# Patient Record
Sex: Male | Born: 1950 | Race: Black or African American | Hispanic: No | Marital: Single | Smoking: Former smoker
Health system: Southern US, Community
[De-identification: ages and names within clinical notes are randomized; demographics above are authoritative.]

## PROBLEM LIST (undated history)

## (undated) DIAGNOSIS — I10 Essential (primary) hypertension: Secondary | ICD-10-CM

## (undated) HISTORY — PX: FRACTURE SURGERY: SHX138

---

## 2016-03-16 DIAGNOSIS — M5416 Radiculopathy, lumbar region: Secondary | ICD-10-CM | POA: Insufficient documentation

## 2016-08-12 DIAGNOSIS — IMO0001 Reserved for inherently not codable concepts without codable children: Secondary | ICD-10-CM | POA: Insufficient documentation

## 2016-08-12 DIAGNOSIS — F17210 Nicotine dependence, cigarettes, uncomplicated: Secondary | ICD-10-CM | POA: Insufficient documentation

## 2016-08-12 DIAGNOSIS — R209 Unspecified disturbances of skin sensation: Secondary | ICD-10-CM

## 2016-08-12 DIAGNOSIS — M542 Cervicalgia: Secondary | ICD-10-CM | POA: Insufficient documentation

## 2016-10-05 HISTORY — PX: BACK SURGERY: SHX140

## 2018-10-10 ENCOUNTER — Encounter: Payer: Self-pay | Admitting: Emergency Medicine

## 2018-10-10 ENCOUNTER — Other Ambulatory Visit: Payer: Self-pay

## 2018-10-10 ENCOUNTER — Emergency Department: Payer: Medicare Other

## 2018-10-10 ENCOUNTER — Emergency Department
Admission: EM | Admit: 2018-10-10 | Discharge: 2018-10-10 | Disposition: A | Discharge status: Home / Self Care | Payer: Medicare Other | Attending: Emergency Medicine | Admitting: Emergency Medicine

## 2018-10-10 DIAGNOSIS — F1210 Cannabis abuse, uncomplicated: Secondary | ICD-10-CM | POA: Insufficient documentation

## 2018-10-10 DIAGNOSIS — K222 Esophageal obstruction: Secondary | ICD-10-CM

## 2018-10-10 DIAGNOSIS — R0789 Other chest pain: Secondary | ICD-10-CM | POA: Insufficient documentation

## 2018-10-10 DIAGNOSIS — Z87891 Personal history of nicotine dependence: Secondary | ICD-10-CM | POA: Insufficient documentation

## 2018-10-10 DIAGNOSIS — M5432 Sciatica, left side: Secondary | ICD-10-CM | POA: Diagnosis not present

## 2018-10-10 DIAGNOSIS — R202 Paresthesia of skin: Secondary | ICD-10-CM | POA: Diagnosis not present

## 2018-10-10 LAB — BASIC METABOLIC PANEL
Anion gap: 9 (ref 5–15)
BUN: 11 mg/dL (ref 8–23)
CO2: 22 mmol/L (ref 22–32)
Calcium: 8.9 mg/dL (ref 8.9–10.3)
Chloride: 108 mmol/L (ref 98–111)
Creatinine, Ser: 0.91 mg/dL (ref 0.61–1.24)
GFR calc Af Amer: >60 mL/min (ref >60)
GFR calc non Af Amer: >60 mL/min (ref >60)
Glucose, Bld: 120 mg/dL — ABNORMAL HIGH (ref 70–99)
Potassium: 3.6 mmol/L (ref 3.5–5.1)
Sodium: 139 mmol/L (ref 135–145)

## 2018-10-10 LAB — CBC
HCT: 44.7 % (ref 39.0–52.0)
Hemoglobin: 15 g/dL (ref 13.0–17.0)
MCH: 28.2 pg (ref 26.0–34.0)
MCHC: 33.6 g/dL (ref 30.0–36.0)
MCV: 84.2 fL (ref 80.0–100.0)
Platelets: 174 10*3/uL (ref 150–400)
RBC: 5.31 MIL/uL (ref 4.22–5.81)
RDW: 14.4 % (ref 11.5–15.5)
WBC: 8.4 10*3/uL (ref 4.0–10.5)
nRBC: 0 % (ref 0.0–0.2)

## 2018-10-10 LAB — TROPONIN I: Troponin I: <0.03 ng/mL (ref <0.03)

## 2018-10-10 MED ORDER — PREDNISONE 50 MG PO TABS: 50.0000 mg | ORAL_TABLET | Freq: Every day | ORAL | 0 refills | Status: AC

## 2018-10-10 NOTE — ED Provider Notes (Signed)
[facility] Emergency Department Provider Note   ____________________________________________    I have reviewed the triage vital signs and the nursing notes.   HISTORY  Chief Complaint Chest Pain     HPI Micheal Powers is a 68 y.o. male who presents with multiple complaints.  Patient reports he has had chest discomfort to the left side of his sternum today which is tender to the touch.  He denies injury to the area.  In addition he reports over the last 2 months he has had episodes of tingling in his left arm.  He also describes a history of sciatica in both legs after spine surgery many years ago.  In addition over the last 2 months he has had difficulty swallowing solid foods and is switched to more of a soft diet.  He has not seen a PCP in over 5 or 6 years.  History reviewed. No pertinent past medical history.  There are no active problems to display for this patient.   History reviewed. No pertinent surgical history.  Prior to Admission medications   Medication Sig Start Date End Date Taking? Authorizing Provider  predniSONE (DELTASONE) 50 MG tablet Take 1 tablet (50 mg total) by mouth daily with breakfast. 10/10/18   Lavonia Drafts, MD     Allergies Patient has no known allergies.  History reviewed. No pertinent family history.  Social History Social History   Tobacco Use  . Smoking status: Former Smoker    Types: Cigarettes    Last attempt to quit: 10/06/2018    Years since quitting: 0.0  . Smokeless tobacco: Never Used  Substance Use Topics  . Alcohol use: Yes    Comment: occasionally  . Drug use: Yes    Frequency: 3.0 times per week    Types: Marijuana    Review of Systems  Constitutional: No fever/chills Eyes: No visual changes.  ENT: As above Cardiovascular: As above Respiratory: Denies shortness of breath. Gastrointestinal: No abdominal pain.   Genitourinary: Negative for dysuria. Musculoskeletal: Negative for back  pain. Skin: Negative for rash. Neurological: Negative for headaches   ____________________________________________   PHYSICAL EXAM:  VITAL SIGNS: ED Triage Vitals  Enc Vitals Group     BP 10/10/18 1542 (!) 168/99     Pulse Rate 10/10/18 1542 72     Resp 10/10/18 1542 18     Temp 10/10/18 1542 98.6 F (37 C)     Temp Source 10/10/18 1542 Oral     SpO2 10/10/18 1542 98 %     Weight 10/10/18 1543 110.7 kg (244 lb)     Height 10/10/18 1543 1.905 m (6\' 3" )     Head Circumference --      Peak Flow --      Pain Score 10/10/18 1543 5     Pain Loc --      Pain Edu? --      Excl. in [place]? --     Constitutional: Alert and oriented. No acute distress.  Eyes: Conjunctivae are normal.   Nose: No congestion/rhinnorhea. Mouth/Throat: Mucous membranes are moist.    Cardiovascular: Normal rate, regular rhythm. Grossly normal heart sounds.  Good peripheral circulation.  Tenderness palpation along the left sternal border, no swelling or erythema Respiratory: Normal respiratory effort.  No retractions. Lungs CTAB. Gastrointestinal: Soft and nontender. No distention.  No CVA tenderness.  Musculoskeletal:   Warm and well perfused, normal strength in all extremities Neurologic:  Normal speech and language. No gross focal neurologic  deficits are appreciated.  Skin:  Skin is warm, dry and intact. No rash noted. Psychiatric: Mood and affect are normal. Speech and behavior are normal.  ____________________________________________   LABS (all labs ordered are listed, but only abnormal results are displayed)  Labs Reviewed  BASIC METABOLIC PANEL - Abnormal; Notable for the following components:      Result Value   Glucose, Bld 120 (*)    All other components within normal limits  CBC  TROPONIN I   ____________________________________________  EKG  ED ECG REPORT I, Lavonia Drafts, the attending physician, personally viewed and interpreted this ECG.  Date: 10/10/2018  Rhythm: normal  sinus rhythm QRS Axis: normal Intervals: normal ST/T Wave abnormalities: normal Narrative Interpretation: no evidence of acute ischemia  ____________________________________________  RADIOLOGY  Chest x-ray unremarkable ____________________________________________   PROCEDURES  Procedure(s) performed: No  Procedures   Critical Care performed: No ____________________________________________   INITIAL IMPRESSION / ASSESSMENT AND PLAN / ED COURSE  Pertinent labs & imaging results that were available during my care of the patient were reviewed by me and considered in my medical decision making (see chart for details).  Patient presents with multiple complaints.  Suspect costochondritis based on exam.  EKG, lab work, chest x-ray is quite reassuring.  In addition the patient appears to be describing esophageal stricture for which he will need follow-up with GI.  It also appears the patient is having significant sciatic/radiculopathy type pain, will refer to neurosurgery.  We will trial steroids to see if this helps with sciatica and costochondritis, recommend NSAIDs as well.      ____________________________________________   FINAL CLINICAL IMPRESSION(S) / ED DIAGNOSES  Final diagnoses:  Chest wall pain  Esophageal ring  Sciatica of left side        Note:  This document was prepared using Dragon voice recognition software and may include unintentional dictation errors.   Lavonia Drafts, MD 10/10/18 2149

## 2018-10-10 NOTE — ED Triage Notes (Signed)
Pt via pov from home with chest pain x more than one month. Pt has been waiting for his insurance to kick in. Also states he has numbness in one or both hands/arms intermittently also for the last month. Endorses sob, n/v (after eating). Pt alert & oriented, nad noted.

## 2018-10-14 ENCOUNTER — Telehealth: Payer: Self-pay | Admitting: Gastroenterology

## 2018-10-14 NOTE — Telephone Encounter (Signed)
Left vm for pt to call office and schedule ed f/u with Dr. Marius Ditch

## 2018-10-18 DIAGNOSIS — I10 Essential (primary) hypertension: Secondary | ICD-10-CM | POA: Insufficient documentation

## 2018-10-18 DIAGNOSIS — Z6832 Body mass index (BMI) 32.0-32.9, adult: Secondary | ICD-10-CM

## 2018-10-18 DIAGNOSIS — E6609 Other obesity due to excess calories: Secondary | ICD-10-CM | POA: Insufficient documentation

## 2018-10-21 ENCOUNTER — Other Ambulatory Visit: Payer: Self-pay | Admitting: Student

## 2018-10-21 DIAGNOSIS — G8929 Other chronic pain: Secondary | ICD-10-CM

## 2018-10-21 DIAGNOSIS — M5442 Lumbago with sciatica, left side: Principal | ICD-10-CM

## 2018-10-21 DIAGNOSIS — M5412 Radiculopathy, cervical region: Secondary | ICD-10-CM

## 2018-10-21 DIAGNOSIS — M5441 Lumbago with sciatica, right side: Principal | ICD-10-CM

## 2018-11-03 ENCOUNTER — Ambulatory Visit
Admission: RE | Admit: 2018-11-03 | Discharge: 2018-11-03 | Disposition: A | Discharge status: Home / Self Care | Payer: Medicare Other | Source: Ambulatory Visit | Attending: Student | Admitting: Student

## 2018-11-03 DIAGNOSIS — G8929 Other chronic pain: Secondary | ICD-10-CM

## 2018-11-03 DIAGNOSIS — M5442 Lumbago with sciatica, left side: Secondary | ICD-10-CM | POA: Insufficient documentation

## 2018-11-03 DIAGNOSIS — M5441 Lumbago with sciatica, right side: Secondary | ICD-10-CM | POA: Diagnosis present

## 2018-11-03 DIAGNOSIS — M5412 Radiculopathy, cervical region: Secondary | ICD-10-CM

## 2018-11-04 ENCOUNTER — Encounter: Payer: Self-pay | Admitting: Gastroenterology

## 2018-11-04 ENCOUNTER — Ambulatory Visit (INDEPENDENT_AMBULATORY_CARE_PROVIDER_SITE_OTHER): Payer: Medicare Other | Admitting: Gastroenterology

## 2018-11-04 ENCOUNTER — Other Ambulatory Visit: Payer: Self-pay

## 2018-11-04 VITALS — BP 137/90 | HR 60 | Resp 17 | Ht 74.0 in | Wt 247.4 lb

## 2018-11-04 DIAGNOSIS — R1013 Epigastric pain: Secondary | ICD-10-CM

## 2018-11-04 DIAGNOSIS — Z1211 Encounter for screening for malignant neoplasm of colon: Secondary | ICD-10-CM

## 2018-11-04 NOTE — Progress Notes (Signed)
Cephas Darby, MD [address]  [place]  [place], [place] [address]  Main: [phone]  Fax: [phone]    Gastroenterology Consultation  Referring Provider:     No ref. provider found Primary Care Physician:  [name], MD Primary Gastroenterologist:  Dr. Cephas Darby Reason for Consultation: Dyspepsia        HPI:   Micheal Powers is a 68 y.o. pleasant male with no significant past medical history referred by Dr. [name], MD  for consultation & management of 2 months h/o food sitting in his epigastric area, bloating and regurgitation.  He denies heartburn, dysphagia.  He has not tried any medications.  He modified his diet to boiled, soft foods which partially relieved his symptoms.  He went to ER early January secondary to chest pain, troponins negative, EKG unremarkable.  He was subsequently seen by his PCP at [facility] after ER visit.  He was suggested no need for any further cardiac work-up  Patient drinks sodas regularly He does report intermittent diarrhea, nonbloody.  Reports gaining weight, has been dealing with low back pain which has limited his physical activity.  He is retired  He smokes up to 4 cigarettes/day, denies drinking alcohol heavily  NSAIDs: None  Antiplts/Anticoagulants/Anti thrombotics: None  GI Procedures: None He denies family history of GI malignancy  No past medical history on file.  No past surgical history on file.  Current Outpatient Medications:  .  amLODipine (NORVASC) 10 MG tablet, Take by mouth., Disp: , Rfl:  .  gabapentin (NEURONTIN) 300 MG capsule, , Disp: , Rfl:  .  aspirin EC 81 MG tablet, Take by mouth., Disp: , Rfl:  .  benazepril (LOTENSIN) 40 MG tablet, Take by mouth., Disp: , Rfl:  .  hydrochlorothiazide (HYDRODIURIL) 25 MG tablet, , Disp: , Rfl:  .  oxyCODONE-acetaminophen (PERCOCET/ROXICET) 5-325 MG tablet, Take by mouth., Disp: , Rfl:  .  predniSONE (DELTASONE) 50 MG tablet, Take 1 tablet (50 mg  total) by mouth daily with breakfast. (Patient not taking: Reported on 11/04/2018), Disp: 5 tablet, Rfl: 0 .  traMADol (ULTRAM) 50 MG tablet, Take by mouth., Disp: , Rfl:   No family history on file.   Social History   Tobacco Use  . Smoking status: Former Smoker    Types: Cigarettes    Last attempt to quit: 10/06/2018    Years since quitting: 0.0  . Smokeless tobacco: Never Used  Substance Use Topics  . Alcohol use: Yes    Comment: occasionally  . Drug use: Yes    Frequency: 3.0 times per week    Types: Marijuana    Allergies as of 11/04/2018  . (No Known Allergies)    Review of Systems:    All systems reviewed and negative except where noted in HPI.   Physical Exam:  BP 137/90 (BP Location: Left Arm, Patient Position: Sitting, Cuff Size: Large)   Pulse 60   Resp 17   Ht 6\' 2"  (1.88 m)   Wt 247 lb 6.4 oz (112.2 kg)   SpO2 98%   BMI 31.76 kg/m  No LMP for male patient.  General:   Alert,  Well-developed, well-nourished, pleasant and cooperative in NAD Head:  Normocephalic and atraumatic. Eyes:  Sclera clear, no icterus.   Conjunctiva pink. Ears:  Normal auditory acuity. Nose:  No deformity, discharge, or lesions. Mouth:  No deformity or lesions,oropharynx pink & moist. Neck:  Supple; no masses or thyromegaly. Lungs:  Respirations even and unlabored.  Clear throughout to auscultation.   No wheezes, crackles, or rhonchi. No acute distress. Heart:  Regular rate and rhythm; no murmurs, clicks, rubs, or gallops. Abdomen:  Normal bowel sounds. Soft, non-tender and non-distended without masses, hepatosplenomegaly or hernias noted.  No guarding or rebound tenderness.   Rectal: Not performed Msk:  Symmetrical without gross deformities. Good, equal movement & strength bilaterally. Pulses:  Normal pulses noted. Extremities:  No clubbing or edema.  No cyanosis. Neurologic:  Alert and oriented x3;  grossly normal neurologically. Skin:  Intact without significant lesions or  rashes. No jaundice. Psych:  Alert and cooperative. Normal mood and affect.  Imaging Studies: No abdominal imaging  Assessment and Plan:   [name] is a 68 y.o. African-American male with hypertension, with dyspepsia and overdue for colon cancer screening  Dyspepsia Recommend EGD Advised him to avoid carbonated beverages completely  Colon cancer screening, overdue Discussed with him about screening colonoscopy  I have discussed alternative options, risks & benefits,  which include, but are not limited to, bleeding, infection, perforation,respiratory complication & drug reaction.  The patient agrees with this plan & written consent will be obtained.     Follow up in 4 to 6 weeks   Cephas Darby, MD

## 2018-11-11 ENCOUNTER — Encounter: Payer: Self-pay | Admitting: Emergency Medicine

## 2018-11-14 ENCOUNTER — Ambulatory Visit
Admission: RE | Admit: 2018-11-14 | Discharge: 2018-11-14 | Disposition: A | Discharge status: Home / Self Care | Payer: Medicare Other | Source: Ambulatory Visit | Attending: Gastroenterology | Admitting: Gastroenterology

## 2018-11-14 ENCOUNTER — Other Ambulatory Visit: Payer: Self-pay

## 2018-11-14 ENCOUNTER — Ambulatory Visit: Payer: Medicare Other | Admitting: Anesthesiology

## 2018-11-14 ENCOUNTER — Encounter
Admission: RE | Disposition: A | Discharge status: Home / Self Care | Payer: Self-pay | Source: Ambulatory Visit | Attending: Gastroenterology

## 2018-11-14 DIAGNOSIS — K2980 Duodenitis without bleeding: Secondary | ICD-10-CM | POA: Diagnosis not present

## 2018-11-14 DIAGNOSIS — D128 Benign neoplasm of rectum: Secondary | ICD-10-CM | POA: Diagnosis not present

## 2018-11-14 DIAGNOSIS — Z7982 Long term (current) use of aspirin: Secondary | ICD-10-CM | POA: Diagnosis not present

## 2018-11-14 DIAGNOSIS — Z1211 Encounter for screening for malignant neoplasm of colon: Secondary | ICD-10-CM | POA: Diagnosis present

## 2018-11-14 DIAGNOSIS — K6289 Other specified diseases of anus and rectum: Secondary | ICD-10-CM | POA: Diagnosis not present

## 2018-11-14 DIAGNOSIS — Z79899 Other long term (current) drug therapy: Secondary | ICD-10-CM | POA: Insufficient documentation

## 2018-11-14 DIAGNOSIS — K30 Functional dyspepsia: Secondary | ICD-10-CM | POA: Insufficient documentation

## 2018-11-14 DIAGNOSIS — Z87891 Personal history of nicotine dependence: Secondary | ICD-10-CM | POA: Diagnosis not present

## 2018-11-14 DIAGNOSIS — K3189 Other diseases of stomach and duodenum: Secondary | ICD-10-CM

## 2018-11-14 DIAGNOSIS — I10 Essential (primary) hypertension: Secondary | ICD-10-CM | POA: Insufficient documentation

## 2018-11-14 DIAGNOSIS — K621 Rectal polyp: Secondary | ICD-10-CM

## 2018-11-14 DIAGNOSIS — R1013 Epigastric pain: Secondary | ICD-10-CM

## 2018-11-14 HISTORY — PX: COLONOSCOPY WITH PROPOFOL: SHX5780

## 2018-11-14 HISTORY — PX: ESOPHAGOGASTRODUODENOSCOPY (EGD) WITH PROPOFOL: SHX5813

## 2018-11-14 HISTORY — DX: Essential (primary) hypertension: I10

## 2018-11-14 SURGERY — ESOPHAGOGASTRODUODENOSCOPY (EGD) WITH PROPOFOL
Anesthesia: General

## 2018-11-14 MED ORDER — LIDOCAINE HCL (CARDIAC) PF 100 MG/5ML IV SOSY
PREFILLED_SYRINGE | INTRAVENOUS | Status: DC | PRN
  Administered 2018-11-14: 30 mg via INTRAVENOUS

## 2018-11-14 MED ORDER — MIDAZOLAM HCL 2 MG/2ML IJ SOLN
INTRAMUSCULAR | Status: DC | PRN
  Administered 2018-11-14: 2 mg via INTRAVENOUS

## 2018-11-14 MED ORDER — PROPOFOL 500 MG/50ML IV EMUL
INTRAVENOUS | Status: DC | PRN
  Administered 2018-11-14: 120 ug/kg/min via INTRAVENOUS

## 2018-11-14 MED ORDER — MIDAZOLAM HCL 2 MG/2ML IJ SOLN
INTRAMUSCULAR | Status: AC
  Filled 2018-11-14: qty 2

## 2018-11-14 MED ORDER — SODIUM CHLORIDE 0.9 % IV SOLN
INTRAVENOUS | Status: DC
  Administered 2018-11-14: 08:00:00 via INTRAVENOUS

## 2018-11-14 MED ORDER — PROPOFOL 500 MG/50ML IV EMUL
INTRAVENOUS | Status: AC
  Filled 2018-11-14: qty 50

## 2018-11-14 MED ORDER — FENTANYL CITRATE (PF) 100 MCG/2ML IJ SOLN
INTRAMUSCULAR | Status: DC | PRN
  Administered 2018-11-14: 25 ug via INTRAVENOUS
  Administered 2018-11-14: 50 ug via INTRAVENOUS
  Administered 2018-11-14: 25 ug via INTRAVENOUS

## 2018-11-14 MED ORDER — BUTAMBEN-TETRACAINE-BENZOCAINE 2-2-14 % EX AERO
INHALATION_SPRAY | CUTANEOUS | Status: AC
  Filled 2018-11-14: qty 5

## 2018-11-14 MED ORDER — FENTANYL CITRATE (PF) 100 MCG/2ML IJ SOLN
INTRAMUSCULAR | Status: AC
  Filled 2018-11-14: qty 2

## 2018-11-14 MED ORDER — LIDOCAINE HCL (PF) 2 % IJ SOLN
INTRAMUSCULAR | Status: AC
  Filled 2018-11-14: qty 10

## 2018-11-14 NOTE — H&P (Signed)
Cephas Darby, MD [address]  [place]  [place], [place] [address]  Main: [phone]  Fax: [phone] Pager: [phone]  Primary Care Physician:  [name], MD Primary Gastroenterologist:  Dr. Cephas Darby  Pre-Procedure History & Physical: HPI:  Micheal Powers is a 68 y.o. male is here for an endoscopy and colonoscopy.   Past Medical History:  Diagnosis Date  . Hypertension     Past Surgical History:  Procedure Laterality Date  . BACK SURGERY  2018   lumbar  . FRACTURE SURGERY Right    ring finger    Prior to Admission medications   Medication Sig Start Date End Date Taking? Authorizing Provider  gabapentin (NEURONTIN) 300 MG capsule  10/18/18  Yes [provider]  hydrochlorothiazide (HYDRODIURIL) 25 MG tablet  10/18/18  Yes [provider]  amLODipine (NORVASC) 10 MG tablet Take by mouth. 10/06/16   [provider]  aspirin EC 81 MG tablet Take by mouth. 08/28/15   [provider]  benazepril (LOTENSIN) 40 MG tablet Take by mouth. 02/11/17   [provider]  oxyCODONE-acetaminophen (PERCOCET/ROXICET) 5-325 MG tablet Take by mouth.    [provider]  predniSONE (DELTASONE) 50 MG tablet Take 1 tablet (50 mg total) by mouth daily with breakfast. Patient not taking: Reported on 11/04/2018 10/10/18   Lavonia Drafts, MD  traMADol (ULTRAM) 50 MG tablet Take by mouth.    [provider]    Allergies as of 11/04/2018  . (No Known Allergies)    History reviewed. No pertinent family history.  Social History   Socioeconomic History  . Marital status: Single    Spouse name: Not on file  . Number of children: Not on file  . Years of education: Not on file  . Highest education level: Not on file  Occupational History  . Not on file  Social Needs  . Financial resource strain: Not on file  . Food insecurity:    Worry: Not on file    Inability: Not on file  . Transportation needs:   Medical: Not on file    Non-medical: Not on file  Tobacco Use  . Smoking status: Former Smoker    Types: Cigarettes    Last attempt to quit: 10/06/2018    Years since quitting: 0.1  . Smokeless tobacco: Never Used  Substance and Sexual Activity  . Alcohol use: Yes    Comment: occasionally  . Drug use: Yes    Frequency: 3.0 times per week    Types: Marijuana    Comment: last 2 weeks  . Sexual activity: Not on file  Lifestyle  . Physical activity:    Days per week: Not on file    Minutes per session: Not on file  . Stress: Not on file  Relationships  . Social connections:    Talks on phone: Not on file    Gets together: Not on file    Attends religious service: Not on file    Active member of club or organization: Not on file    Attends meetings of clubs or organizations: Not on file    Relationship status: Not on file  . Intimate partner violence:    Fear of current or ex partner: Not on file    Emotionally abused: Not on file    Physically abused: Not on file    Forced sexual activity: Not on file  Other Topics Concern  . Not on file  Social History Narrative  .  Not on file    Review of Systems: See HPI, otherwise negative ROS  Physical Exam: BP (!) 163/99   Pulse 71   Temp 98.3 F (36.8 C) (Oral)   Resp 18   Ht 6\' 2"  (1.88 m)   Wt 111.6 kg   SpO2 100%   BMI 31.58 kg/m  General:   Alert,  pleasant and cooperative in NAD Head:  Normocephalic and atraumatic. Neck:  Supple; no masses or thyromegaly. Lungs:  Clear throughout to auscultation.    Heart:  Regular rate and rhythm. Abdomen:  Soft, nontender and nondistended. Normal bowel sounds, without guarding, and without rebound.   Neurologic:  Alert and  oriented x4;  grossly normal neurologically.  Impression/Plan: Valon Glasscock is here for an endoscopy and colonoscopy to be performed for dyspepsia,colon cancer screening  Risks, benefits, limitations, and alternatives regarding  endoscopy and colonoscopy  have been reviewed with the patient.  Questions have been answered.  All parties agreeable.   Sherri Sear, MD  11/14/2018, 8:28 AM

## 2018-11-14 NOTE — Anesthesia Procedure Notes (Signed)
Performed by: Cook-Martin, Tonjua Rossetti Pre-anesthesia Checklist: Patient identified, Emergency Drugs available, Suction available, Patient being monitored and Timeout performed Patient Re-evaluated:Patient Re-evaluated prior to induction Oxygen Delivery Method: Nasal cannula Preoxygenation: Pre-oxygenation with 100% oxygen Induction Type: IV induction Placement Confirmation: CO2 detector and positive ETCO2       

## 2018-11-14 NOTE — Anesthesia Preprocedure Evaluation (Signed)
Anesthesia Evaluation  Patient identified by MRN, date of birth, ID band Patient awake    Reviewed: Allergy & Precautions, H&P , NPO status , Patient's Chart, lab work & pertinent test results, reviewed documented beta blocker date and time   History of Anesthesia Complications Negative for: history of anesthetic complications  Airway Mallampati: I  TM Distance: >3 FB Neck ROM: full    Dental  (+) Dental Advidsory Given, Caps, Missing, Teeth Intact, Chipped   Pulmonary neg pulmonary ROS, former smoker,           Cardiovascular Exercise Tolerance: Good hypertension, (-) angina(-) CAD, (-) Past MI, (-) Cardiac Stents and (-) CABG (-) dysrhythmias (-) Valvular Problems/Murmurs     Neuro/Psych negative neurological ROS  negative psych ROS   GI/Hepatic negative GI ROS, Neg liver ROS,   Endo/Other  negative endocrine ROS  Renal/GU negative Renal ROS  negative genitourinary   Musculoskeletal   Abdominal   Peds  Hematology negative hematology ROS (+)   Anesthesia Other Findings Past Medical History: No date: Hypertension   Reproductive/Obstetrics negative OB ROS                             Anesthesia Physical Anesthesia Plan  ASA: II  Anesthesia Plan: General   Post-op Pain Management:    Induction: Intravenous  PONV Risk Score and Plan: 2 and Propofol infusion and TIVA  Airway Management Planned: Natural Airway and Nasal Cannula  Additional Equipment:   Intra-op Plan:   Post-operative Plan:   Informed Consent: I have reviewed the patients History and Physical, chart, labs and discussed the procedure including the risks, benefits and alternatives for the proposed anesthesia with the patient or authorized representative who has indicated his/her understanding and acceptance.     Dental Advisory Given  Plan Discussed with: Anesthesiologist, CRNA and Surgeon  Anesthesia Plan  Comments:         Anesthesia Quick Evaluation

## 2018-11-14 NOTE — Anesthesia Post-op Follow-up Note (Signed)
Anesthesia QCDR form completed.        

## 2018-11-14 NOTE — Op Note (Signed)
[facility] Gastroenterology Patient Name: Micheal Powers Procedure Date: 11/14/2018 7:46 AM MRN: [id number] Account #: [id number] Date of Birth: [birth year] Admit Type: Outpatient Age: 68 Room: [facility] ENDO ROOM 2 Gender: Male Note Status: Finalized Procedure:            Upper GI endoscopy Indications:          Dyspepsia, Indigestion Providers:            Lin Landsman MD, MD Referring MD:         [name] (Referring MD) Medicines:            Monitored Anesthesia Care Complications:        No immediate complications. Estimated blood loss:                        Minimal. Procedure:            Pre-Anesthesia Assessment:                       - Prior to the procedure, a History and Physical was                        performed, and patient medications and allergies were                        reviewed. The patient is competent. The risks and                        benefits of the procedure and the sedation options and                        risks were discussed with the patient. All questions                        were answered and informed consent was obtained.                        Patient identification and proposed procedure were                        verified by the physician, the nurse, the                        anesthesiologist, the anesthetist and the technician in                        the pre-procedure area in the procedure room in the                        endoscopy suite. Mental Status Examination: alert and                        oriented. Airway Examination: normal oropharyngeal                        airway and neck mobility. Respiratory Examination:                        clear to auscultation. CV Examination: normal.  Prophylactic Antibiotics: The patient does not require                        prophylactic antibiotics. Prior Anticoagulants: The                        patient has taken no previous anticoagulant  or                        antiplatelet agents. ASA Grade Assessment: II - A                        patient with mild systemic disease. After reviewing the                        risks and benefits, the patient was deemed in                        satisfactory condition to undergo the procedure. The                        anesthesia plan was to use monitored anesthesia care                        (MAC). Immediately prior to administration of                        medications, the patient was re-assessed for adequacy                        to receive sedatives. The heart rate, respiratory rate,                        oxygen saturations, blood pressure, adequacy of                        pulmonary ventilation, and response to care were                        monitored throughout the procedure. The physical status                        of the patient was re-assessed after the procedure.                       After obtaining informed consent, the endoscope was                        passed under direct vision. Throughout the procedure,                        the patient's blood pressure, pulse, and oxygen                        saturations were monitored continuously. The Endoscope                        was introduced through the mouth, and advanced to the  second part of duodenum. The upper GI endoscopy was                        accomplished without difficulty. The patient tolerated                        the procedure well. Findings:      A medium-sized polypoid and ulcerated questionable mass/fullness with no       bleeding was found filling the duodenal bulb. Biopsies were taken with a       cold forceps for histology.      The second portion of the duodenum was normal.      Diffuse mildly erythematous mucosa without bleeding was found in the       gastric body. Biopsies were taken with a cold forceps for Helicobacter       pylori testing.      Two umbilicated  lesions or sperficial ulcers with raised edges measuring       5 mm in diameter were found in the prepyloric region of the stomach.      The incisura and gastric antrum were normal. Biopsies were taken with a       cold forceps for Helicobacter pylori testing.      The cardia and gastric fundus were normal on retroflexion.      Esophagogastric landmarks were identified: the gastroesophageal junction       was found at 38 cm from the incisors.      The gastroesophageal junction and examined esophagus were normal. Impression:           - Rule out malignancy, duodenal mass. Biopsied.                       - Normal second portion of the duodenum.                       - Erythematous mucosa in the gastric body. Biopsied.                       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                       - Normal incisura and antrum. Biopsied.                       - Esophagogastric landmarks identified.                       - Normal gastroesophageal junction and esophagus. Recommendation:       - Await pathology results. Procedure Code(s):    --- Professional ---                       [phone], Esophagogastroduodenoscopy, flexible, transoral;                        with biopsy, single or multiple Diagnosis Code(s):    --- Professional ---                       K31.89, Other diseases of  stomach and duodenum                       R10.13, Epigastric pain                       K30, Functional dyspepsia CPT copyright 2018 [employer]. All rights reserved. The codes documented in this report are preliminary and upon coder review may  be revised to meet current compliance requirements. Dr. Ulyess Mort Lin Landsman MD, MD 11/14/2018 9:16:29 AM This report has been signed electronically. Number of Addenda: 0 Note Initiated On: 11/14/2018 7:46 AM      [facility]

## 2018-11-14 NOTE — Op Note (Signed)
[facility] Gastroenterology Patient Name: Micheal Powers Procedure Date: 11/14/2018 7:45 AM MRN: [id number] Account #: [id number] Date of Birth: July 24, 1951 Admit Type: Outpatient Age: 68 Room: [facility] ENDO ROOM 2 Gender: Male Note Status: Finalized Procedure:            Colonoscopy Indications:          Screening for colorectal malignant neoplasm, This is                        the patient's first colonoscopy Providers:            Lin Landsman MD, MD Referring MD:         [name] (Referring MD) Medicines:            Monitored Anesthesia Care Complications:        No immediate complications. Estimated blood loss: None. Procedure:            Pre-Anesthesia Assessment:                       - Prior to the procedure, a History and Physical was                        performed, and patient medications and allergies were                        reviewed. The patient is competent. The risks and                        benefits of the procedure and the sedation options and                        risks were discussed with the patient. All questions                        were answered and informed consent was obtained.                        Patient identification and proposed procedure were                        verified by the physician, the nurse, the                        anesthesiologist, the anesthetist and the technician in                        the pre-procedure area in the procedure room in the                        endoscopy suite. Mental Status Examination: alert and                        oriented. Airway Examination: normal oropharyngeal                        airway and neck mobility. Respiratory Examination:                        clear to auscultation. CV Examination: normal.  Prophylactic Antibiotics: The patient does not require                        prophylactic antibiotics. Prior Anticoagulants: The          patient has taken no previous anticoagulant or                        antiplatelet agents. ASA Grade Assessment: II - A                        patient with mild systemic disease. After reviewing the                        risks and benefits, the patient was deemed in                        satisfactory condition to undergo the procedure. The                        anesthesia plan was to use monitored anesthesia care                        (MAC). Immediately prior to administration of                        medications, the patient was re-assessed for adequacy                        to receive sedatives. The heart rate, respiratory rate,                        oxygen saturations, blood pressure, adequacy of                        pulmonary ventilation, and response to care were                        monitored throughout the procedure. The physical status                        of the patient was re-assessed after the procedure.                       After obtaining informed consent, the colonoscope was                        passed under direct vision. Throughout the procedure,                        the patient's blood pressure, pulse, and oxygen                        saturations were monitored continuously. The                        Colonoscope was introduced through the anus and                        advanced to the the terminal ileum, with identification  of the appendiceal orifice and IC valve. The                        colonoscopy was performed without difficulty. The                        patient tolerated the procedure well. The quality of                        the bowel preparation was adequate to identify polyps 6                        mm and larger in size. Findings:      The digital rectal exam revealed a soft rectal mass palpated 5.0 cm from       the anal verge.      The terminal ileum appeared normal.      A 15 mm polyp was found in the  rectum (benign-appearing lesion). The       polyp was sessile. The polyp was removed with a hot snare. Resection and       retrieval were complete.      The retroflexed view of the distal rectum and anal verge was normal and       showed no anal or rectal abnormalities.      The exam was otherwise without abnormality.      A small amount of liquid stool was found in the entire colon,       interfering with visualization. Lavage of the area was performed using       50 - 200 mL of sterile water, resulting in clearance with adequate       visualization. Impression:           - Rectal mass 5.0 cm from the anal verge.                       - The examined portion of the ileum was normal.                       - One benign appearing 15 mm polyp in the rectum,                        removed with a hot snare. Resected and retrieved.                       - The distal rectum and anal verge are normal on                        retroflexion view.                       - The examination was otherwise normal.                       - Stool in the entire examined colon. Recommendation:       - Discharge patient to home (with spouse).                       - Resume previous diet today.                       -  Continue present medications.                       - Await pathology results.                       - Repeat colonoscopy in 3 years for surveillance.                       - Return to my office as previously scheduled. Procedure Code(s):    --- Professional ---                       [phone], Colonoscopy, flexible; with removal of tumor(s),                        polyp(s), or other lesion(s) by snare technique Diagnosis Code(s):    --- Professional ---                       Z12.11, Encounter for screening for malignant neoplasm                        of colon                       K62.89, Other specified diseases of anus and rectum                       K62.1, Rectal polyp CPT copyright 2018  American Medical Association. All rights reserved. The codes documented in this report are preliminary and upon coder review may  be revised to meet current compliance requirements. Dr. Ulyess Mort Lin Landsman MD, MD 11/14/2018 9:41:00 AM This report has been signed electronically. Number of Addenda: 0 Note Initiated On: 11/14/2018 7:45 AM Scope Withdrawal Time: 0 hours 15 minutes 42 seconds  Total Procedure Duration: 0 hours 18 minutes 33 seconds       [facility]

## 2018-11-14 NOTE — Transfer of Care (Signed)
Immediate Anesthesia Transfer of Care Note  Patient: Micheal Powers  Procedure(s) Performed: ESOPHAGOGASTRODUODENOSCOPY (EGD) WITH PROPOFOL (N/A ) COLONOSCOPY WITH PROPOFOL (N/A )  Patient Location: PACU  Anesthesia Type:General  Level of Consciousness: awake and sedated  Airway & Oxygen Therapy: Patient Spontanous Breathing and Patient connected to nasal cannula oxygen  Post-op Assessment: Report given to RN and Post -op Vital signs reviewed and stable  Post vital signs: Reviewed and stable  Last Vitals:  Vitals Value Taken Time  BP    Temp    Pulse    Resp    SpO2      Last Pain:  Vitals:   11/14/18 0800  TempSrc: Oral  PainSc: 9          Complications: No apparent anesthesia complications

## 2018-11-14 NOTE — Anesthesia Postprocedure Evaluation (Signed)
Anesthesia Post Note  Patient: Suleiman Finigan  Procedure(s) Performed: ESOPHAGOGASTRODUODENOSCOPY (EGD) WITH PROPOFOL (N/A ) COLONOSCOPY WITH PROPOFOL (N/A )  Patient location during evaluation: Endoscopy Anesthesia Type: General Level of consciousness: awake and alert Pain management: pain level controlled Vital Signs Assessment: post-procedure vital signs reviewed and stable Respiratory status: spontaneous breathing, nonlabored ventilation, respiratory function stable and patient connected to nasal cannula oxygen Cardiovascular status: blood pressure returned to baseline and stable Postop Assessment: no apparent nausea or vomiting Anesthetic complications: no     Last Vitals:  Vitals:   11/14/18 0950 11/14/18 1000  BP: 96/81 (!) 123/99  Pulse: 63 61  Resp: 14 14  Temp:    SpO2: 98% 98%    Last Pain:  Vitals:   11/14/18 0940  TempSrc: Tympanic  PainSc:                  Martha Clan

## 2018-11-15 ENCOUNTER — Encounter: Payer: Self-pay | Admitting: Gastroenterology

## 2018-11-15 ENCOUNTER — Other Ambulatory Visit: Payer: Self-pay

## 2018-11-15 DIAGNOSIS — A048 Other specified bacterial intestinal infections: Secondary | ICD-10-CM

## 2018-11-15 LAB — SURGICAL PATHOLOGY

## 2018-11-15 MED ORDER — AMOXICILLIN 500 MG PO TABS: 1000.0000 mg | ORAL_TABLET | Freq: Two times a day (BID) | ORAL | 0 refills | Status: AC

## 2018-11-15 MED ORDER — CLARITHROMYCIN 500 MG PO TABS: 500.0000 mg | ORAL_TABLET | Freq: Two times a day (BID) | ORAL | 0 refills | Status: AC

## 2018-11-15 MED ORDER — OMEPRAZOLE 40 MG PO CPDR: 40.0000 mg | DELAYED_RELEASE_CAPSULE | Freq: Two times a day (BID) | ORAL | 0 refills | Status: AC

## 2018-11-16 ENCOUNTER — Encounter: Payer: Self-pay | Admitting: Gastroenterology

## 2018-12-08 DIAGNOSIS — G8929 Other chronic pain: Secondary | ICD-10-CM | POA: Insufficient documentation

## 2018-12-08 DIAGNOSIS — M545 Low back pain: Secondary | ICD-10-CM

## 2018-12-15 ENCOUNTER — Ambulatory Visit (INDEPENDENT_AMBULATORY_CARE_PROVIDER_SITE_OTHER): Payer: Medicare Other | Admitting: Gastroenterology

## 2018-12-15 ENCOUNTER — Encounter: Payer: Self-pay | Admitting: Gastroenterology

## 2018-12-15 ENCOUNTER — Other Ambulatory Visit: Payer: Self-pay

## 2018-12-15 VITALS — BP 114/77 | HR 67 | Resp 17 | Ht 74.0 in | Wt 254.4 lb

## 2018-12-15 DIAGNOSIS — K2970 Gastritis, unspecified, without bleeding: Secondary | ICD-10-CM | POA: Diagnosis not present

## 2018-12-15 DIAGNOSIS — B9681 Helicobacter pylori [H. pylori] as the cause of diseases classified elsewhere: Secondary | ICD-10-CM | POA: Diagnosis not present

## 2018-12-15 NOTE — Progress Notes (Signed)
Micheal Darby, MD [address]  [place]  [place], [place] [address]  Main: [phone]  Fax: [phone]    Gastroenterology Consultation  Referring Provider:     [name], MD Primary Care Physician:  [name], MD Primary Gastroenterologist:  Dr. Cephas Powers Reason for Consultation: Dyspepsia from H. pylori infection        HPI:   Micheal Powers is a 68 y.o. pleasant male with no significant past medical history referred by Dr. [name], MD  for consultation & management of 2 months h/o food sitting in his epigastric area, bloating and regurgitation.  He denies heartburn, dysphagia.  He has not tried any medications.  He modified his diet to boiled, soft foods which partially relieved his symptoms.  He went to ER early January secondary to chest pain, troponins negative, EKG unremarkable.  He was subsequently seen by his PCP at [facility] after ER visit.  He was suggested no need for any further cardiac work-up  Patient drinks sodas regularly He does report intermittent diarrhea, nonbloody.  Reports gaining weight, has been dealing with low back pain which has limited his physical activity.  He is retired  He smokes up to 4 cigarettes/day, denies drinking alcohol heavily  Follow-up visit 12/15/2018 Patient underwent EGD which revealed H. pylori gastritis, status post triple therapy which he finished about 1 and half weeks ago.  Tolerated antibiotics well.  He denies recurrence of his dyspepsia symptoms since treatment of H. pylori.  NSAIDs: None  Antiplts/Anticoagulants/Anti thrombotics: None  GI Procedures: EGD and colonoscopy 11/14/2018  - Rule out malignancy, duodenal mass. Biopsied. - Normal second portion of the duodenum. - Erythematous mucosa in the gastric body. Biopsied. - Two lesions suspicious for aberrant pancreas were found in the stomach. - Normal incisura and antrum. Biopsied. - Esophagogastric landmarks identified. - Normal  gastroesophageal junction and esophagus.  - The examined portion of the ileum was normal. - One benign appearing 15 mm polyp in the rectum, removed with a hot snare. Resected and retrieved. - The distal rectum and anal verge are normal on retroflexion view. - The examination was otherwise normal. - Stool in the entire examined colon.  DIAGNOSIS:  A. DUODENAL BULB; COLD BIOPSY:  - ENTERIC MUCOSA DISPLAYING ACUTE DUODENITIS.  - NEGATIVE FOR FEATURES OF CELIAC, DYSPLASIA, AND MALIGNANCY.  B. 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  - NEGATIVE FOR DYSPLASIA AND MALIGNANCY.   C. RECTAL POLYP; HOT SNARE:  - TUBULOVILLOUS ADENOMA.  - NEGATIVE FOR HIGH-GRADE DYSPLASIA AND MALIGNANCY.  He denies family history of GI malignancy  Past Medical History:  Diagnosis Date  . Hypertension     Past Surgical History:  Procedure Laterality Date  . BACK SURGERY  2018   lumbar  . COLONOSCOPY WITH PROPOFOL N/A 11/14/2018   Procedure: COLONOSCOPY WITH PROPOFOL;  Surgeon: Lin Landsman, MD;  Location: [facility] ENDOSCOPY;  Service: Gastroenterology;  Laterality: N/A;  . ESOPHAGOGASTRODUODENOSCOPY (EGD) WITH PROPOFOL N/A 11/14/2018   Procedure: ESOPHAGOGASTRODUODENOSCOPY (EGD) WITH PROPOFOL;  Surgeon: Lin Landsman, MD;  Location: [facility] ENDOSCOPY;  Service: Gastroenterology;  Laterality: N/A;  . FRACTURE SURGERY Right    ring finger    Current Outpatient Medications:  .  gabapentin (NEURONTIN) 100 MG capsule, , Disp: , Rfl:  .  hydrochlorothiazide (HYDRODIURIL) 25 MG tablet, , Disp: , Rfl:  .  amLODipine (NORVASC) 10 MG tablet, Take by mouth., Disp: , Rfl:  .  aspirin EC  81 MG tablet, Take by mouth., Disp: , Rfl:  .  benazepril (LOTENSIN) 40 MG tablet, Take by mouth., Disp: , Rfl:  .  gabapentin (NEURONTIN) 300 MG capsule, , Disp: , Rfl:  .  omeprazole (PRILOSEC) 40 MG capsule, Take 1 capsule (40 mg total) by mouth 2 (two) times daily for 14  days., Disp: 28 capsule, Rfl: 0 .  oxyCODONE-acetaminophen (PERCOCET/ROXICET) 5-325 MG tablet, Take by mouth., Disp: , Rfl:  .  predniSONE (DELTASONE) 50 MG tablet, Take 1 tablet (50 mg total) by mouth daily with breakfast. (Patient not taking: Reported on 11/04/2018), Disp: 5 tablet, Rfl: 0 .  traMADol (ULTRAM) 50 MG tablet, Take by mouth., Disp: , Rfl:   No family history on file.   Social History   Tobacco Use  . Smoking status: Former Smoker    Types: Cigarettes    Last attempt to quit: 10/06/2018    Years since quitting: 0.1  . Smokeless tobacco: Never Used  Substance Use Topics  . Alcohol use: Yes    Comment: occasionally  . Drug use: Yes    Frequency: 3.0 times per week    Types: Marijuana    Comment: last 2 weeks    Allergies as of 12/15/2018  . (No Known Allergies)    Review of Systems:    All systems reviewed and negative except where noted in HPI.   Physical Exam:  BP 114/77 (BP Location: Left Arm, Patient Position: Sitting, Cuff Size: Large)   Pulse 67   Resp 17   Ht 6\' 2"  (1.88 m)   Wt 254 lb 6.4 oz (115.4 kg)   BMI 32.66 kg/m  No LMP for male patient.  General:   Alert,  Well-developed, well-nourished, pleasant and cooperative in NAD Head:  Normocephalic and atraumatic. Eyes:  Sclera clear, no icterus.   Conjunctiva pink. Ears:  Normal auditory acuity. Nose:  No deformity, discharge, or lesions. Mouth:  No deformity or lesions,oropharynx pink & moist. Neck:  Supple; no masses or thyromegaly. Lungs:  Respirations even and unlabored.  Clear throughout to auscultation.   No wheezes, crackles, or rhonchi. No acute distress. Heart:  Regular rate and rhythm; no murmurs, clicks, rubs, or gallops. Abdomen:  Normal bowel sounds. Soft, non-tender and non-distended without masses, hepatosplenomegaly or hernias noted.  No guarding or rebound tenderness.   Rectal: Not performed Msk:  Symmetrical without gross deformities. Good, equal movement & strength bilaterally.  Pulses:  Normal pulses noted. Extremities:  No clubbing or edema.  No cyanosis. Neurologic:  Alert and oriented x3;  grossly normal neurologically. Skin:  Intact without significant lesions or rashes. No jaundice. Psych:  Alert and cooperative. Normal mood and affect.  Imaging Studies: No abdominal imaging  Assessment and Plan:   Micheal Powers is a 69 y.o. African-American male with hypertension here for follow-up of dyspepsia   Dyspepsia secondary to H. pylori gastritis, biopsy-proven Status post triple therapy Will perform H. pylori breath test 4 weeks after completion of treatment, off proton pump inhibitor  Tubulovillous adenoma of the rectum Recommend surveillance colonoscopy in 3 years The palpable lesion on digital rectal exam was the rectal polyp that was resected  Follow up as needed   Micheal Darby, MD

## 2019-05-15 ENCOUNTER — Telehealth: Payer: Self-pay | Admitting: Gastroenterology

## 2019-05-15 NOTE — Telephone Encounter (Signed)
LVM notifying pt to come to lab on available hours for H Pylori testing also provided instructions before testing

## 2019-05-15 NOTE — Telephone Encounter (Signed)
Patient came in the office this morning about having something done with the lab. He is unsure of what? Please call him to verify.

## 2019-06-23 ENCOUNTER — Other Ambulatory Visit: Payer: Self-pay | Admitting: Neurology

## 2019-06-23 DIAGNOSIS — R42 Dizziness and giddiness: Secondary | ICD-10-CM

## 2019-06-23 DIAGNOSIS — R519 Headache, unspecified: Secondary | ICD-10-CM

## 2019-06-30 ENCOUNTER — Other Ambulatory Visit: Payer: Self-pay

## 2019-06-30 ENCOUNTER — Ambulatory Visit
Admission: RE | Admit: 2019-06-30 | Discharge: 2019-06-30 | Disposition: A | Discharge status: Home / Self Care | Payer: Medicare Other | Source: Ambulatory Visit | Attending: Neurology | Admitting: Neurology

## 2019-06-30 DIAGNOSIS — R51 Headache: Secondary | ICD-10-CM | POA: Insufficient documentation

## 2019-06-30 DIAGNOSIS — R519 Headache, unspecified: Secondary | ICD-10-CM

## 2019-06-30 DIAGNOSIS — R42 Dizziness and giddiness: Secondary | ICD-10-CM

## 2019-09-27 ENCOUNTER — Other Ambulatory Visit: Payer: Self-pay

## 2019-09-27 DIAGNOSIS — A048 Other specified bacterial intestinal infections: Secondary | ICD-10-CM

## 2019-09-29 LAB — H. PYLORI BREATH TEST: H pylori Breath Test: NEGATIVE

## 2020-06-12 ENCOUNTER — Encounter: Payer: Self-pay | Admitting: Orthopaedic Surgery

## 2020-06-12 ENCOUNTER — Ambulatory Visit (INDEPENDENT_AMBULATORY_CARE_PROVIDER_SITE_OTHER): Payer: Medicare Other | Admitting: Orthopaedic Surgery

## 2020-06-12 DIAGNOSIS — Z9889 Other specified postprocedural states: Secondary | ICD-10-CM | POA: Diagnosis not present

## 2020-06-12 DIAGNOSIS — M4802 Spinal stenosis, cervical region: Secondary | ICD-10-CM | POA: Diagnosis not present

## 2020-06-12 NOTE — Progress Notes (Signed)
Office Visit Note   Patient: Micheal Powers           Date of Birth: [birth year]           MRN: [id number] Visit Date: 06/12/2020              Requested by: Micheal Body, MD [facility] [place],  [place] [id number] PCP: Micheal Body, MD   Assessment & Plan: Visit Diagnoses:  1. Spinal stenosis of cervical region   2. History of lumbar laminectomy for spinal cord decompression   3.     Foraminal stenosis of the cervical region  Plan: We discussed and reviewed his previous MRI scans images as well as report.  He also had a previous scan done in 2017 cervical.  He has some cervical spondylosis with 2 levels of narrowing with severe foraminal stenosis.  Centrally has some mild stenosis most impressive at C5-6.  We will check him back again in a month we discussed some of the options for treatment.  His lumbar region appears to be well decompressed from the multilevel lumbar decompression.  He understands that the cervical decompression and fusion anteriorly 2 levels would be an elective procedure he can consider.  Recheck 1 month.  We discussed reimaging of his cervical spine if he decides he would like to consider surgery.  Follow-Up Instructions: Return in about 1 month (around 07/12/2020).   Orders:  No orders of the defined types were placed in this encounter.  No orders of the defined types were placed in this encounter.     Procedures: No procedures performed   Clinical Data: No additional findings.   Subjective: Chief Complaint  Patient presents with  . Lower Back - Pain    HPI 69 year old male here at the request of the [place] for evaluation of neck pain and also low back pain.  Patient been ambulating with a cane states has had some back pain for about 4 years.  Increased neck pain that radiates into the shoulders.  Has had some numbness and tingling in his feet.  He states he has pain when he walks has pain when he sits or stands.   He has been taking Lyrica with some improvement.  Previous lumbar decompression surgery done in [place] 2017.  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 Patient states he does better if he is leaning forward and leans over a grocery cart when he goes in the store. Review of Systems postural smoking history and previous lumbar surgery decompression 2017 L2 to S1.  Positive for hypertension.   Objective: Vital Signs: BP 127/83   Pulse (!) 57   Ht 6\' 2"  (1.88 m)   Wt 254 lb 6.4 oz (115.4 kg)   BMI 32.66 kg/m   Physical Exam Constitutional:      Appearance: He is well-developed.  HENT:     Head: Normocephalic and atraumatic.  Eyes:     Pupils: Pupils are equal, round, and reactive to light.  Neck:     Thyroid: No thyromegaly.     Trachea: No tracheal deviation.  Cardiovascular:     Rate and Rhythm: Normal rate.  Pulmonary:     Effort: Pulmonary effort is normal.     Breath sounds: No wheezing.  Abdominal:     General: Bowel sounds are normal.     Palpations: Abdomen is soft.  Skin:    General: Skin is warm and dry.     Capillary Refill: Capillary  refill takes less than 2 seconds.  Neurological:     Mental Status: He is alert and oriented to person, place, and time.  Psychiatric:        Behavior: Behavior normal.        Thought Content: Thought content normal.        Judgment: Judgment normal.     Ortho Exam patient has negative impingement right left shoulder.  Positive brachial plexus tenderness worse on the left than right.  Positive Spurling on the left.  Negative on the right.  Increased pain with cervical compression get some relief with distraction.  No thenar or hyperthenar atrophy.  Biceps triceps take good resistance.  He has some sciatic notch tenderness both right and left negative straight leg raising 90 degrees.  Well-healed lumbar incision.  Anterior tib gastrocsoleus is intact.  Has slight decrease sensation of the left lateral foot otherwise sensation lower  extremities is normal.  Specialty Comments:  No specialty comments available.  Imaging: CLINICAL DATA:  Chronic neck stiffness for the past 6 years.  EXAM: MRI CERVICAL SPINE WITHOUT CONTRAST  TECHNIQUE: Multiplanar, multisequence MR imaging of the cervical spine was performed. No intravenous contrast was administered.  COMPARISON:  None.  FINDINGS: Alignment: Reversal of the normal cervical lordosis.  Vertebrae: No fracture, evidence of discitis, or bone lesion. Degenerative perifacet marrow edema on the left at C7-T1.  Cord: Normal signal and morphology.  Posterior Fossa, vertebral arteries, paraspinal tissues: Negative.  Disc levels:  C2-C3: Small posterior disc osteophyte complex. Severe right and mild left facet arthropathy. Severe right and moderate left neuroforaminal stenosis. Effacement of the ventral thecal sac without spinal canal stenosis.  C3-C4: Left eccentric posterior disc osteophyte complex. Left uncovertebral hypertrophy. Severe left and moderate right neuroforaminal stenosis. Effacement of the ventral thecal sac with slight flattening of the ventral cord. No spinal canal stenosis.  C4-C5: Posterior disc osteophyte complex and mild bilateral uncovertebral hypertrophy. Severe left and moderate right neuroforaminal stenosis. Effacement of the ventral thecal sac without spinal canal stenosis.  C5-C6: Posterior disc osteophyte complex and bilateral uncovertebral hypertrophy. Mild spinal canal stenosis. Severe bilateral neuroforaminal stenosis.  C6-C7: Posterior disc osteophyte complex and right foraminal disc protrusion. Mild to moderate central spinal canal stenosis. Severe bilateral neuroforaminal stenosis.  C7-T1: Disc uncovering with minimal bulging. Severe left and moderate right facet arthropathy. Moderate left neuroforaminal stenosis. No spinal canal or right neuroforaminal stenosis.  IMPRESSION: 1. Cervical spondylosis as  described above. Multilevel severe neuroforaminal stenosis. Mild-to-moderate spinal canal stenosis at C6-C7.   Electronically Signed   By: [name] M.D.   On: 11/03/2018 12:17  Narrative & Impression  CLINICAL DATA:  Constant low back pain with recurring bilateral leg numbness and shooting pains. Prior surgery 2 years ago.  EXAM: MRI LUMBAR SPINE WITHOUT CONTRAST  TECHNIQUE: Multiplanar, multisequence MR imaging of the lumbar spine was performed. No intravenous contrast was administered.  COMPARISON:  None.  FINDINGS: Segmentation: Assumed standard. The last well-formed disc space is designated L5-S1 for the purposes of this report.  Alignment:  Trace stepwise retrolisthesis from L2-L3 through L5-S1.  Vertebrae: No fracture, evidence of discitis, or bone lesion. Degenerative endplate marrow changes from L2-L3 through L5-S1.  Conus medullaris and cauda equina: Conus extends to the L1-L2 level. Conus and cauda equina appear normal.  Paraspinal and other soft tissues: Paraspinous postsurgical changes from L2-L3 through L5-S1.  Disc levels:  T12-L1:  Negative.  L1-L2:  Negative.  L2-L3: Prior posterior decompression. Circumferential disc osteophyte complex. Mild  bilateral neuroforaminal stenosis. No spinal canal stenosis.  L3-L4: Prior posterior decompression. Circumferential disc osteophyte complex and mild bilateral facet arthropathy. Moderate left neuroforaminal stenosis. No spinal canal or right neuroforaminal stenosis.  L4-L5: Prior posterior decompression. Circumferential disc osteophyte complex and mild bilateral facet arthropathy. No stenosis.  L5-S1: Prior posterior decompression. Disc bulging and endplate spurring with superimposed left subarticular and foraminal disc protrusion. Posterior displacement of the descending left S1 nerve root. Moderate bilateral neuroforaminal stenosis. No spinal canal stenosis.  IMPRESSION: 1.  Left subarticular and foraminal disc protrusion at L5-S1 with mass effect on the descending left S1 nerve root. Moderate bilateral neuroforaminal stenosis at this level. 2. Prior L2-L3 through L5-S1 posterior decompression with patent spinal canal. Moderate left neuroforaminal stenosis at L3-L4.   Electronically Signed   By: [name] M.D.   On: 11/03/2018 12:08    PMFS History: Patient Active Problem List   Diagnosis Date Noted  . Foraminal stenosis of cervical region 06/12/2020  . Spinal stenosis of cervical region 06/12/2020  . History of lumbar laminectomy for spinal cord decompression 06/12/2020  . Chronic bilateral low back pain 12/08/2018  . Dyspepsia   . Class 1 obesity due to excess calories with serious comorbidity and Powers mass index (BMI) of 32.0 to 32.9 in adult 10/18/2018  . Essential hypertension 10/18/2018  . Neck pain 08/12/2016  . Paresthesias/numbness 08/12/2016  . Cigarette nicotine dependence without complication 17/40/8144   Past Medical History:  Diagnosis Date  . Hypertension     History reviewed. No pertinent family history.  Past Surgical History:  Procedure Laterality Date  . BACK SURGERY  2018   lumbar  . COLONOSCOPY WITH PROPOFOL N/A 11/14/2018   Procedure: COLONOSCOPY WITH PROPOFOL;  Surgeon: Lin Landsman, MD;  Location: [facility] ENDOSCOPY;  Service: Gastroenterology;  Laterality: N/A;  . ESOPHAGOGASTRODUODENOSCOPY (EGD) WITH PROPOFOL N/A 11/14/2018   Procedure: ESOPHAGOGASTRODUODENOSCOPY (EGD) WITH PROPOFOL;  Surgeon: Lin Landsman, MD;  Location: [facility] ENDOSCOPY;  Service: Gastroenterology;  Laterality: N/A;  . FRACTURE SURGERY Right    ring finger   Social History   Occupational History  . Not on file  Tobacco Use  . Smoking status: Former Smoker    Types: Cigarettes    Quit date: 10/06/2018    Years since quitting: 1.6  . Smokeless tobacco: Never Used  Vaping Use  . Vaping Use: Never used  Substance and Sexual  Activity  . Alcohol use: Yes    Comment: occasionally  . Drug use: Yes    Frequency: 3.0 times per week    Types: Marijuana    Comment: last 2 weeks  . Sexual activity: Not on file

## 2020-06-17 ENCOUNTER — Telehealth: Payer: Self-pay | Admitting: Orthopaedic Surgery

## 2020-06-17 NOTE — Telephone Encounter (Signed)
06/12/20 ov notes faxed to [facility] [phone]

## 2020-07-10 ENCOUNTER — Ambulatory Visit: Payer: Medicare Other | Admitting: Orthopaedic Surgery

## 2020-07-15 ENCOUNTER — Telehealth: Payer: Self-pay | Admitting: Orthopaedic Surgery

## 2020-07-15 NOTE — Telephone Encounter (Signed)
06/12/20 ov note refaxed to [facility] [phone], attn: Miscielle Mitre

## 2021-05-07 IMAGING — MR MR HEAD W/O CM
11 series · 44 of 48 positions shown · non-contrast
Comparison: No pertinent prior studies available for comparison.

CLINICAL DATA: Headache, unspecified headache type. Additional
history provided: In 8117, patient hit head in fall, daily sharp
posterior headaches since fall that have become "searing"

EXAM:
MRI HEAD WITHOUT CONTRAST
TECHNIQUE: Multiplanar, multiecho pulse sequences of the brain and surrounding
structures were obtained without intravenous contrast.

[Series 5: ax dwi_tracew · axial · 3.0mm · 0.60mm/px · z∈[-80,+75]mm · 4 of 48 slices shown]
[im 1/48]
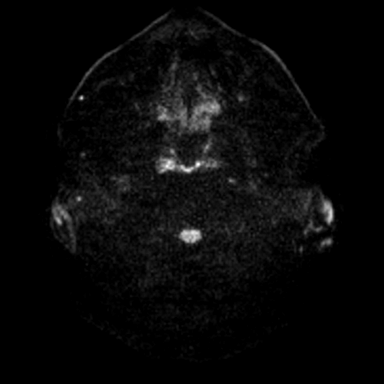
[im 16/48]
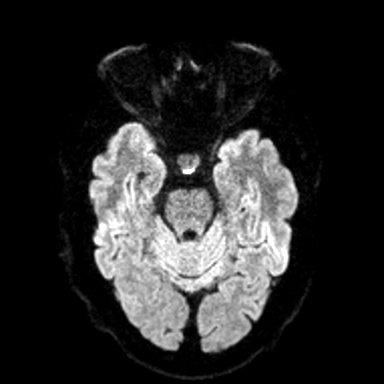
[im 32/48]
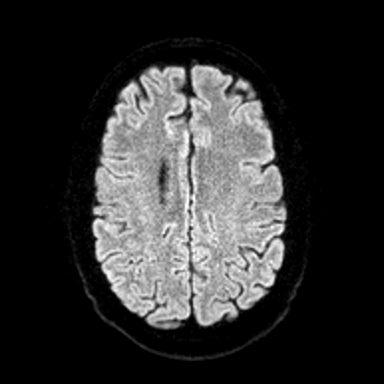
[im 48/48]
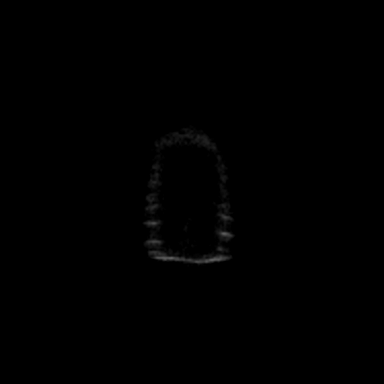

[Series 6: ax dwi_adc · axial · 3.0mm · 0.60mm/px · z∈[-80,+75]mm · 4 of 48 slices shown]
[im 1/48]
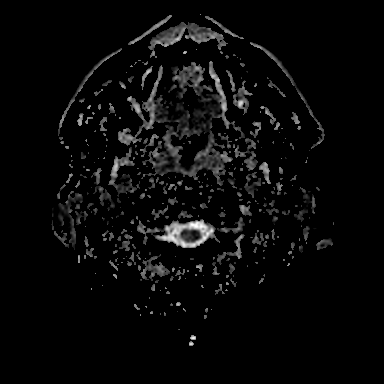
[im 16/48]
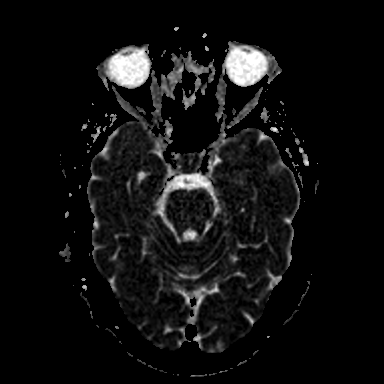
[im 32/48]
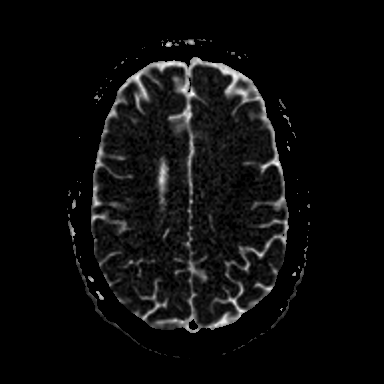
[im 48/48]
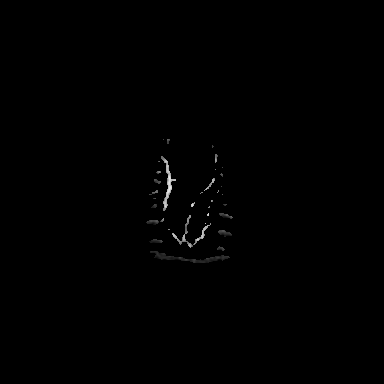

[Series 7: cor dwi_tracew · coronal · 5.0mm · 0.60mm/px · 3 of 40 slices shown]
[im 1/40]
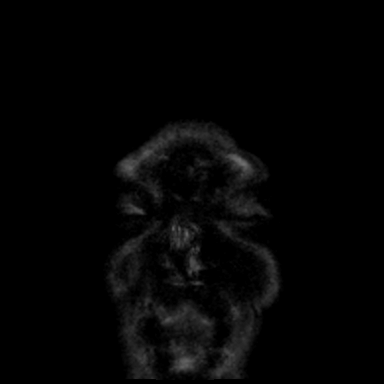
[im 20/40]
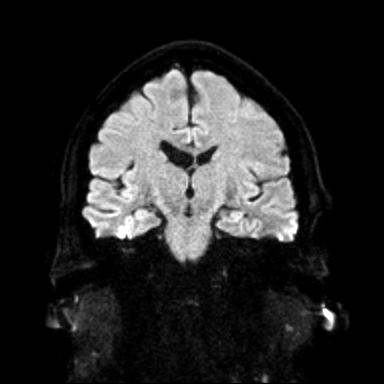
[im 40/40]
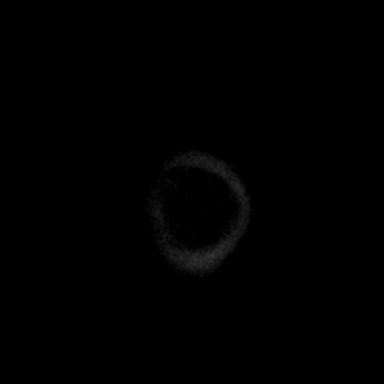

[Series 8: cor dwi_adc · coronal · 5.0mm · 0.60mm/px · 3 of 40 slices shown]
[im 1/40]
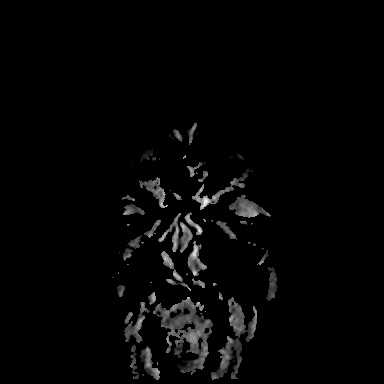
[im 20/40]
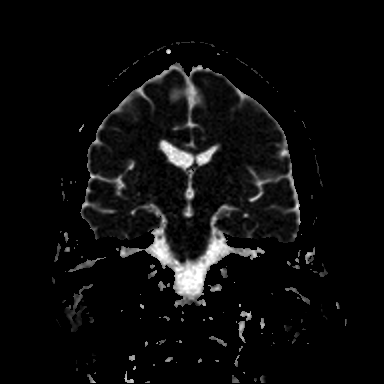
[im 40/40]
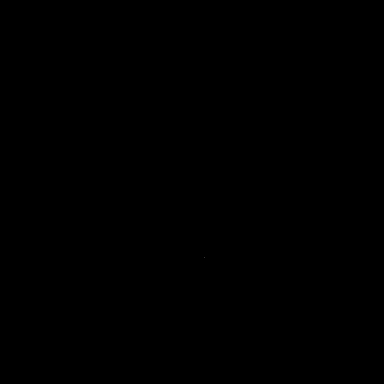

[Series 9: T1 · sagittal · 5.0mm · 0.62mm/px · 2 of 25 slices shown (1 of 2)]
[im 1/25]
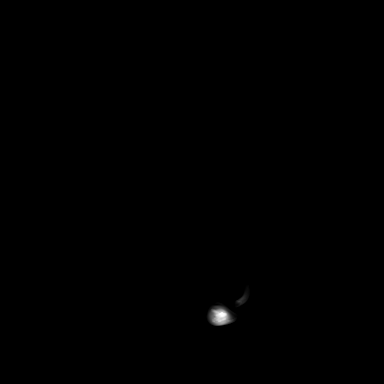
[im 25/25]
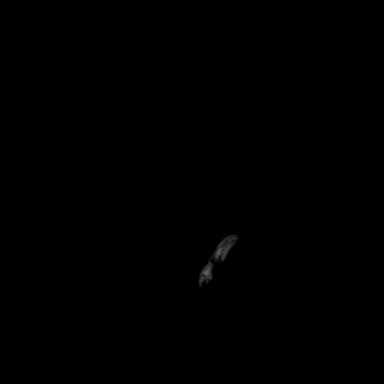

[Series 10: T2 · axial · 5.0mm · 0.53mm/px · z∈[-79,+77]mm · 2 of 27 slices shown (1 of 2)]
[im 1/27]
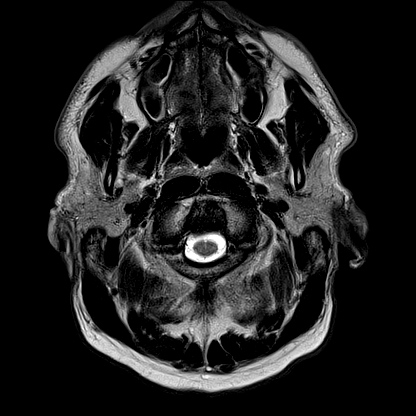
[im 27/27]
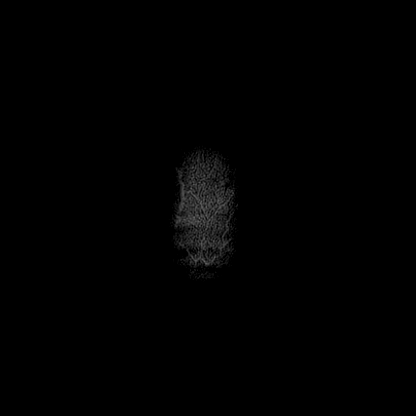

[Series 12: pha_images · axial · 3.0mm · 0.90mm/px · z∈[-89,+85]mm · 5 of 59 slices shown]
[im 1/59]
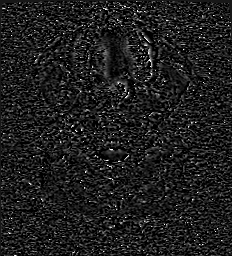
[im 15/59]
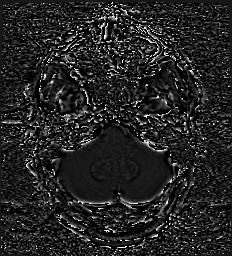
[im 30/59]
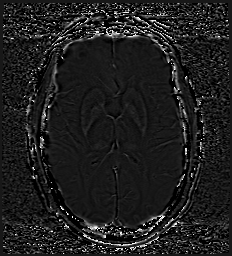
[im 44/59]
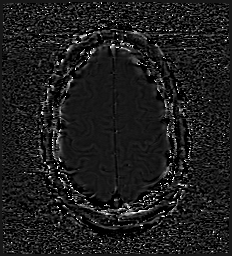
[im 59/59]
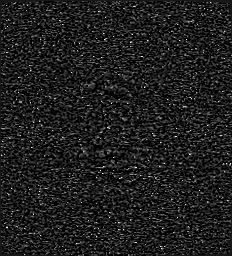

[Series 13: swi_images · axial · 3.0mm · 0.90mm/px · z∈[-89,+88]mm · 5 of 60 slices shown]
[im 1/60]
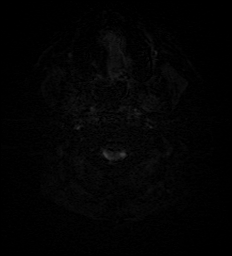
[im 15/60]
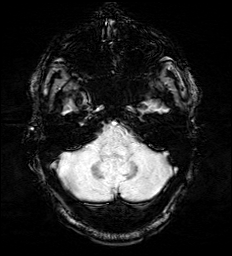
[im 30/60]
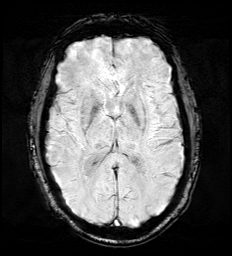
[im 45/60]
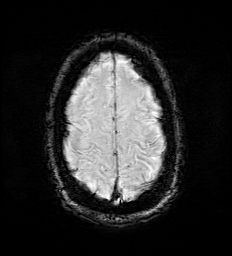
[im 60/60]
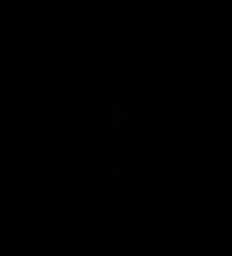

[Series 15: FLAIR · axial · 3.0mm · 0.53mm/px · z∈[-82,+80]mm · 4 of 55 slices shown]
[im 1/55]
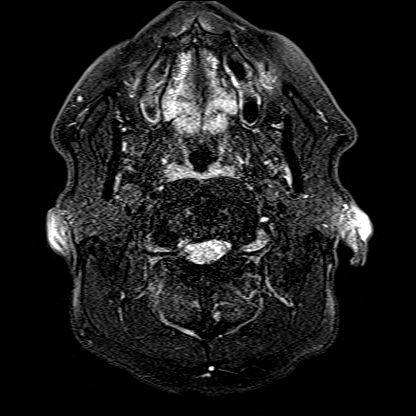
[im 19/55]
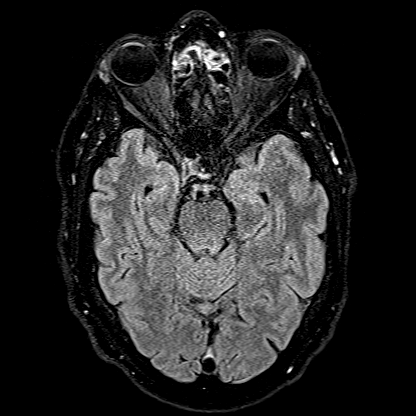
[im 37/55]
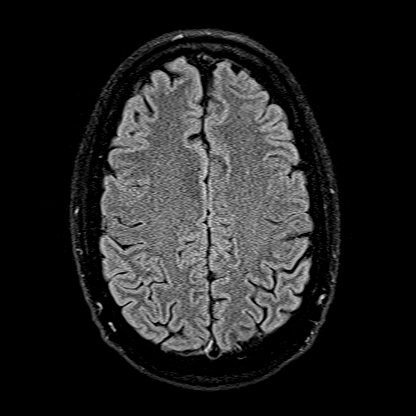
[im 55/55]
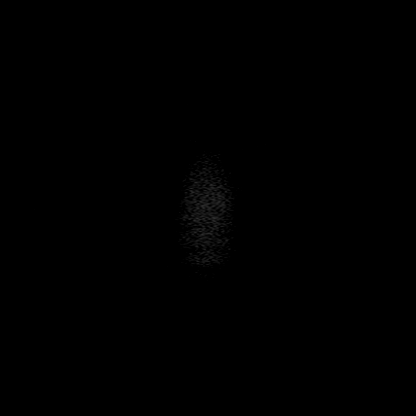

[Series 16: T1 · axial · 1.0mm · 0.98mm/px · z∈[-87,+87]mm · 10 of 176 slices shown (2 of 2)]
[im 1/176]
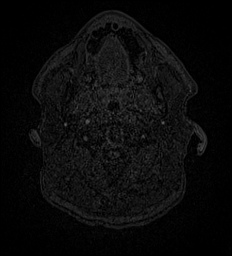
[im 14/176]
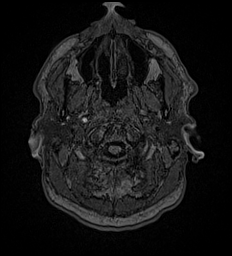
[im 27/176]
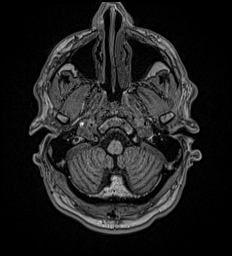
[im 41/176]
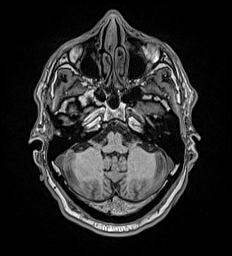
[im 54/176]
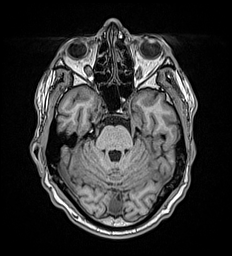
[im 81/176]
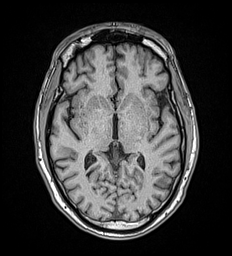
[im 95/176]
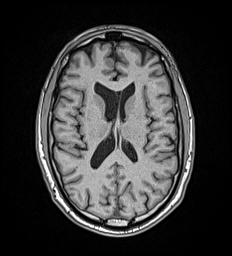
[im 122/176]
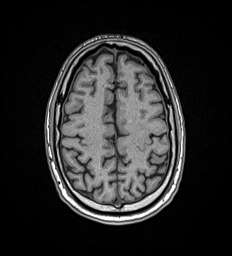
[im 149/176]
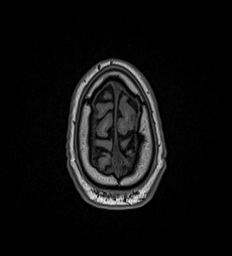
[im 176/176]
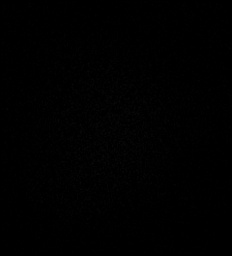

[Series 17: T2 · coronal · 5.0mm · 0.57mm/px · 2 of 29 slices shown (2 of 2)]
[im 1/29]
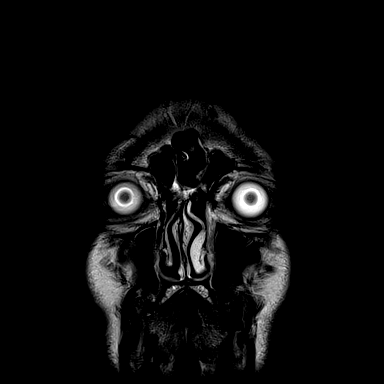
[im 29/29]
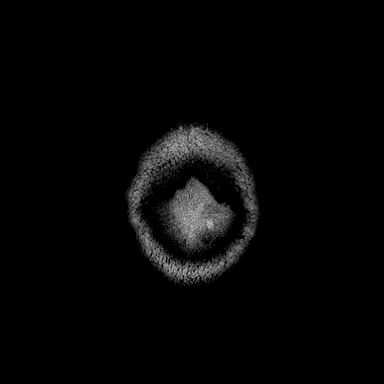

[44 of 48 positions shown; findings below may reference images not displayed]

FINDINGS: Brain:

There is no convincing evidence of acute infarct.

No evidence of intracranial mass.

No midline shift or extra-axial fluid collection.

No chronic intracranial blood products.

No focal parenchymal signal abnormality.

Cerebral volume is normal for age.

Vascular: Flow voids maintained within the proximal large arterial
vessels.

Skull and upper cervical spine: Normal marrow signal.

Sinuses/Orbits: Visualized orbits demonstrate no acute abnormality.
Mild scattered paranasal sinus mucosal thickening. No significant
mastoid effusion.
IMPRESSION: Normal MRI appearance of the brain for age. No evidence of acute
intracranial abnormality.
